# Patient Record
Sex: Female | Born: 1965 | Race: Black or African American | Hispanic: No | Marital: Married | Smoking: Current every day smoker
Health system: Southern US, Community
[De-identification: ages and names within clinical notes are randomized; demographics above are authoritative.]

---

## 2005-05-02 ENCOUNTER — Ambulatory Visit: Payer: Self-pay | Admitting: Family Medicine

## 2005-05-13 ENCOUNTER — Ambulatory Visit: Payer: Self-pay | Admitting: Family Medicine

## 2007-03-16 IMAGING — US US PELV - US TRANSVAGINAL
1 series · 17 of 25 positions shown · non-contrast
Comparison: none

REASON FOR EXAM: Leiomyoma of uterus, eval uterine fibroids
COMMENTS:

[Series 1: us pelv - us transvaginal · 17 of 53 slices shown]
[im 1/53]
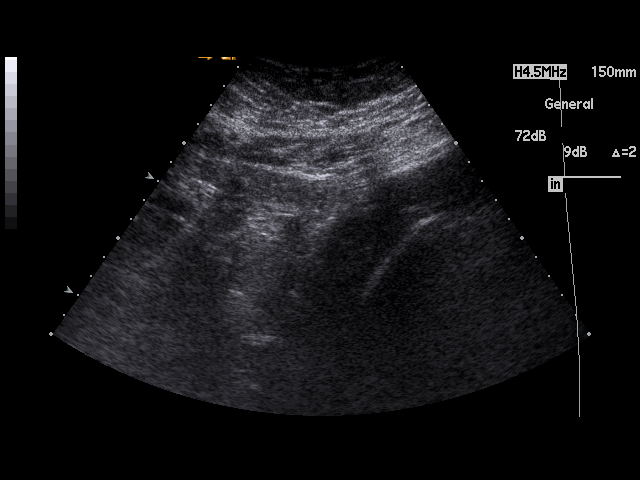
[im 5/53]
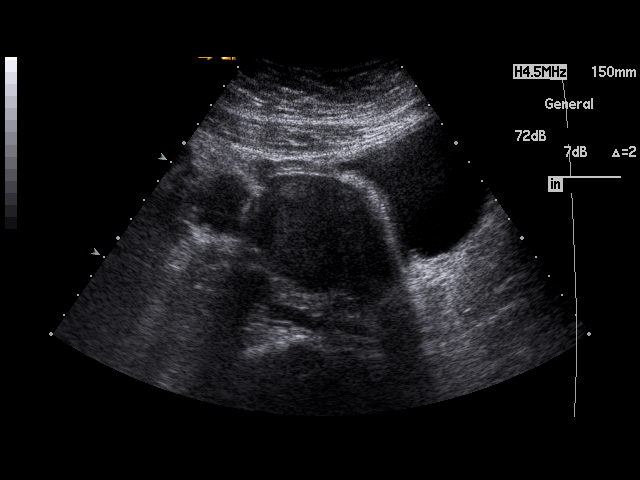
[im 7/53]
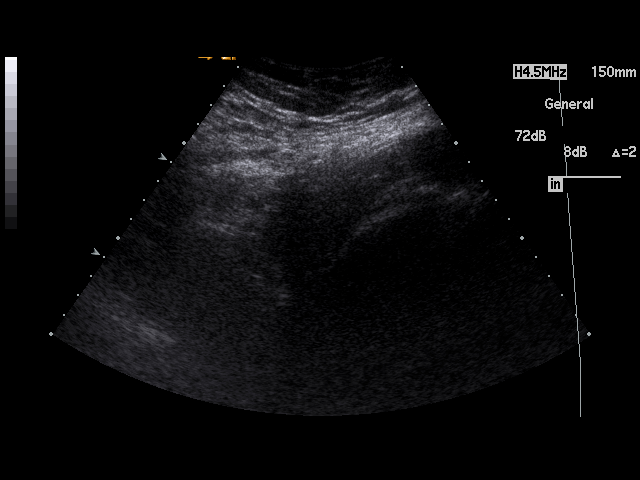
[im 11/53]
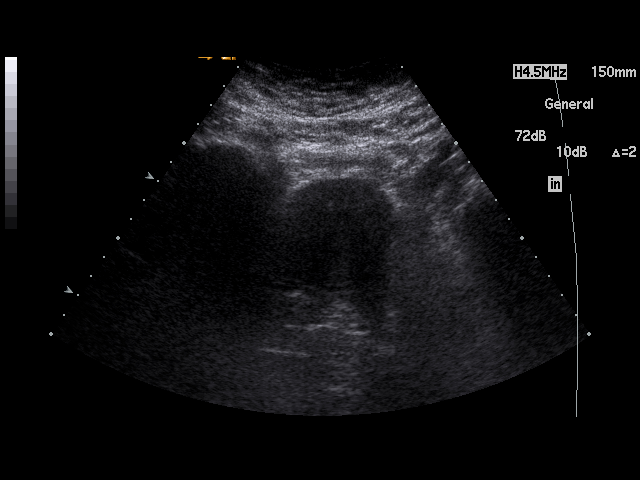
[im 14/53]
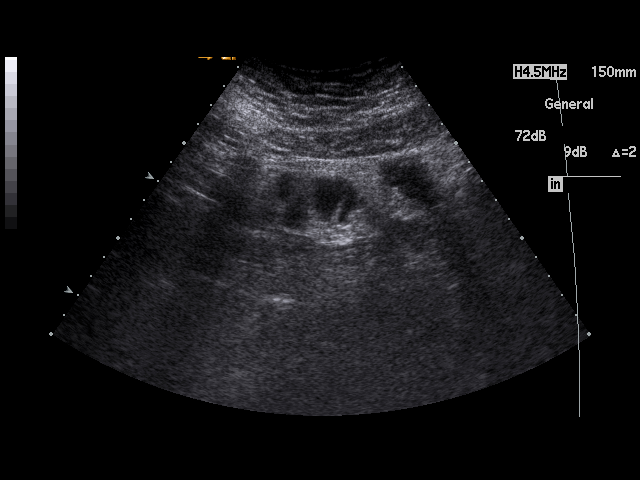
[im 18/53]
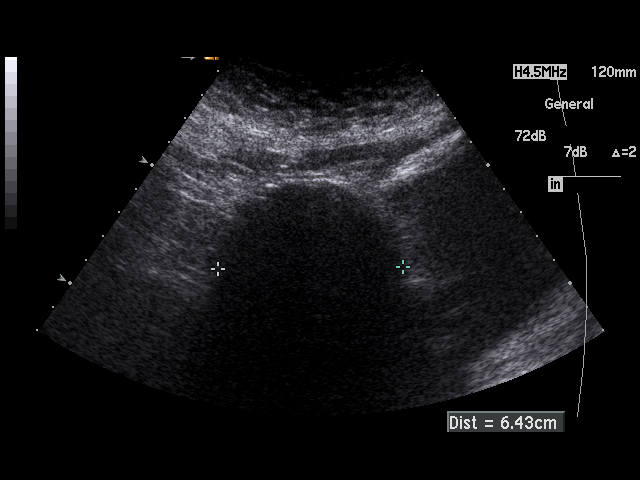
[im 20/53]
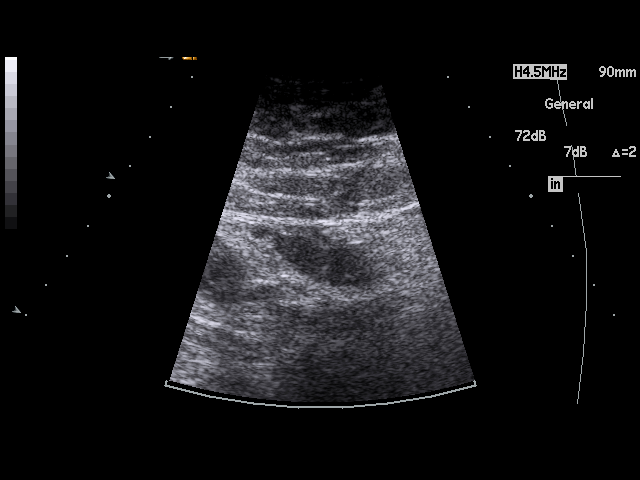
[im 24/53]
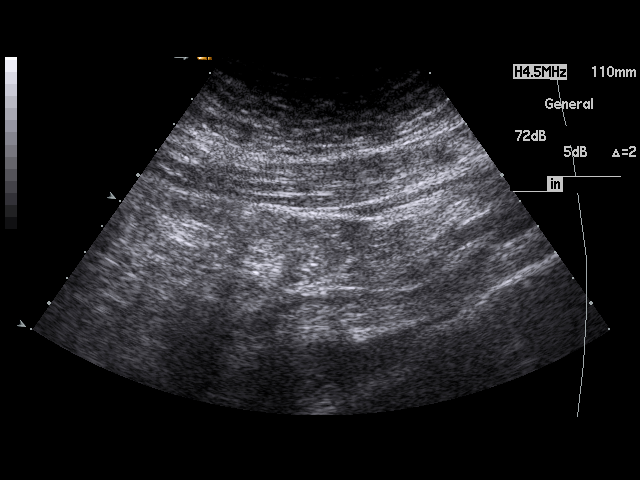
[im 27/53]
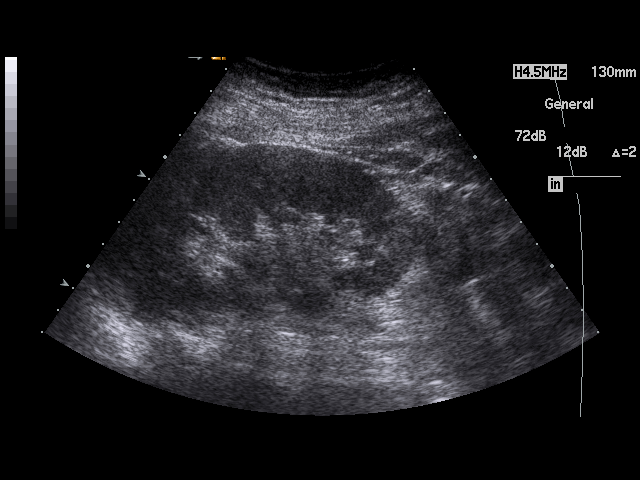
[im 29/53]
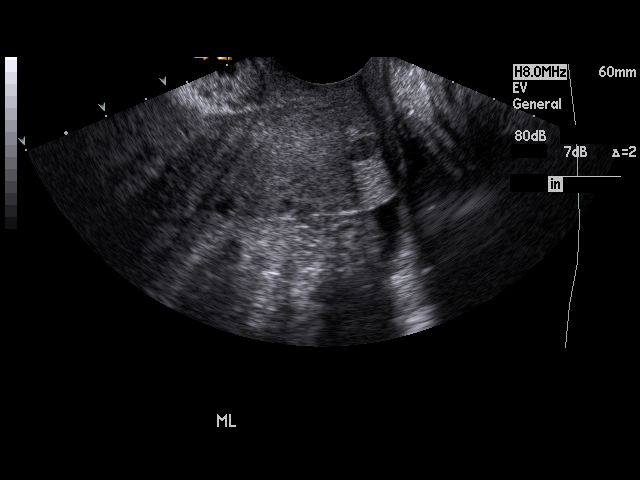
[im 33/53]
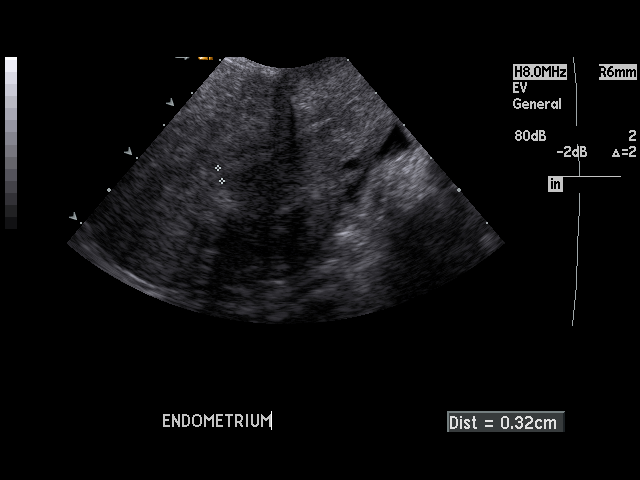
[im 35/53]
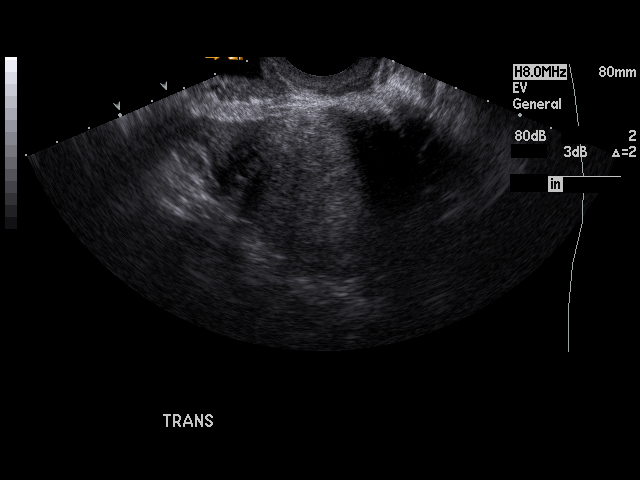
[im 40/53]
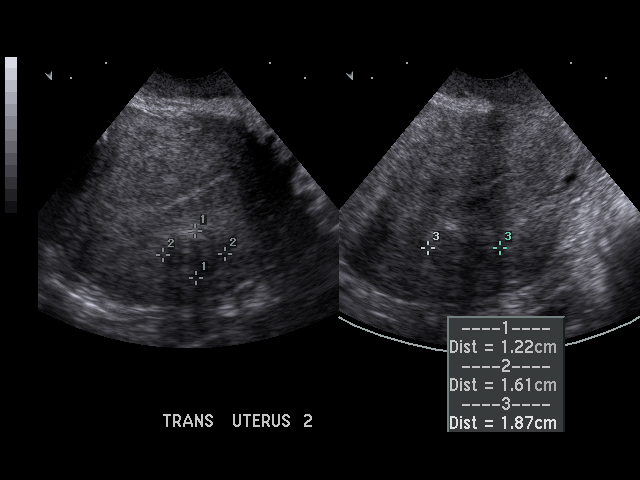
[im 42/53]
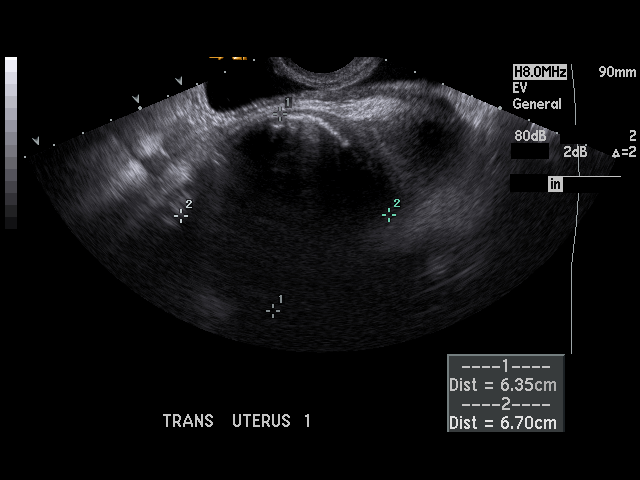
[im 46/53]
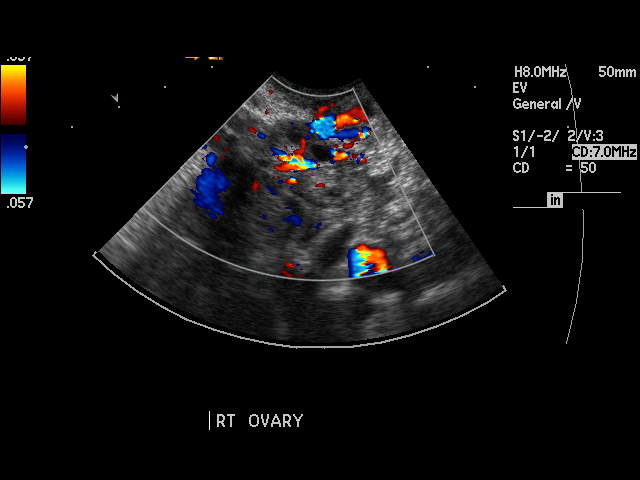
[im 48/53]
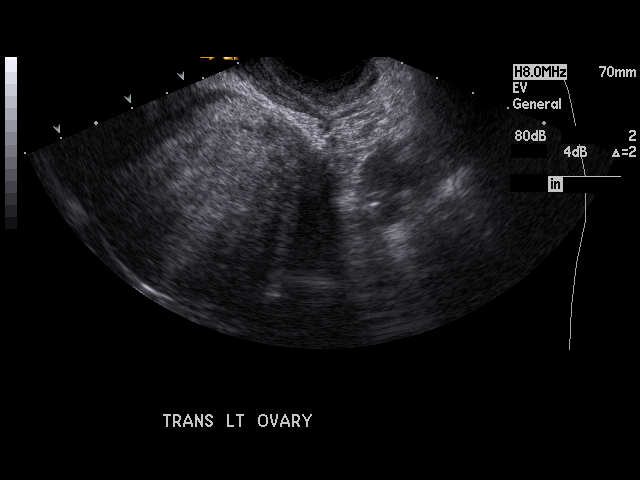
[im 53/53]
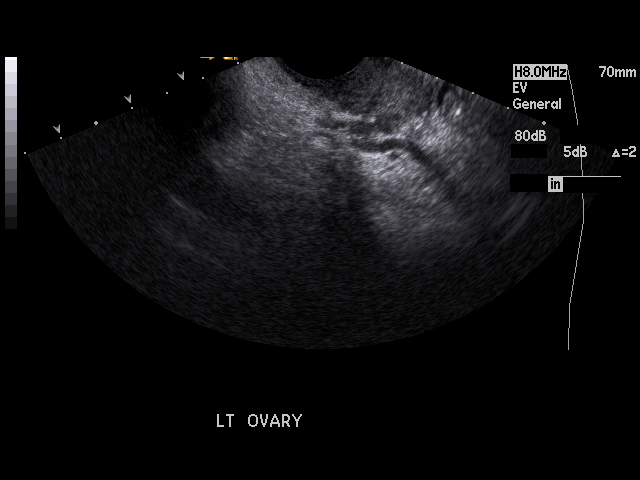

[17 of 25 positions shown; findings below may reference images not displayed]

PROCEDURE:     US  - US PELVIS MASS EXAM  - [DATE] [DATE] [DATE] [DATE]

RESULT:          The uterus measures 9.9 x 5 x 9.9 cm.  A heterogeneous mass
is demonstrated within the intramural portion of the fundus of the uterus
measuring 1.2 x 1.6 x 1.9 cm indicative of a fibroid.  A second large
heterogeneous mass is demonstrated within the fundus of the uterus measuring
6.4 x 7.5 x 5.7 cm, also indicative of a fibroid.  No further uterine masses
are identified.

The LEFT ovary measures 2.5 x 1.8 x 2.9 cm.  The RIGHT ovary measures 1.7 x
1.9 x 2.8 cm.  Each demonstrates peripheral small hypoechoic follicles.  A
trace amount of fluid is demonstrated within the cul-de-sac region.

The RIGHT and LEFT kidneys demonstrate no evidence of hydronephrosis, masses
or calculi.  Note, small cysts are demonstrated within the region of the
cervix indicative of nabothian cysts.
IMPRESSION: 1.     Uterine fibroids within the fundus of the uterus as described above.
2.     No further sonographic abnormalities.

## 2014-04-29 ENCOUNTER — Emergency Department: Payer: Self-pay | Admitting: Emergency Medicine

## 2016-06-04 ENCOUNTER — Encounter: Payer: Self-pay | Admitting: Emergency Medicine

## 2016-06-04 ENCOUNTER — Emergency Department
Admission: EM | Admit: 2016-06-04 | Discharge: 2016-06-04 | Disposition: A | Discharge status: Home / Self Care | Payer: BLUE CROSS/BLUE SHIELD | Attending: Emergency Medicine | Admitting: Emergency Medicine

## 2016-06-04 DIAGNOSIS — Y929 Unspecified place or not applicable: Secondary | ICD-10-CM | POA: Insufficient documentation

## 2016-06-04 DIAGNOSIS — S29012A Strain of muscle and tendon of back wall of thorax, initial encounter: Secondary | ICD-10-CM

## 2016-06-04 DIAGNOSIS — Y999 Unspecified external cause status: Secondary | ICD-10-CM | POA: Insufficient documentation

## 2016-06-04 DIAGNOSIS — S299XXA Unspecified injury of thorax, initial encounter: Secondary | ICD-10-CM | POA: Diagnosis present

## 2016-06-04 DIAGNOSIS — X58XXXA Exposure to other specified factors, initial encounter: Secondary | ICD-10-CM | POA: Insufficient documentation

## 2016-06-04 DIAGNOSIS — F17200 Nicotine dependence, unspecified, uncomplicated: Secondary | ICD-10-CM | POA: Insufficient documentation

## 2016-06-04 DIAGNOSIS — Y939 Activity, unspecified: Secondary | ICD-10-CM | POA: Diagnosis not present

## 2016-06-04 MED ORDER — KETOROLAC TROMETHAMINE 30 MG/ML IJ SOLN
30.0000 mg | Freq: Once | INTRAMUSCULAR | Status: AC
  Administered 2016-06-04: 30 mg via INTRAMUSCULAR
  Filled 2016-06-04: qty 1

## 2016-06-04 MED ORDER — ORPHENADRINE CITRATE 30 MG/ML IJ SOLN
60.0000 mg | Freq: Once | INTRAMUSCULAR | Status: AC
  Administered 2016-06-04: 60 mg via INTRAMUSCULAR
  Filled 2016-06-04: qty 2

## 2016-06-04 MED ORDER — MELOXICAM 15 MG PO TABS: 15.0000 mg | ORAL_TABLET | Freq: Every day | ORAL | 0 refills | Status: AC

## 2016-06-04 MED ORDER — CYCLOBENZAPRINE HCL 10 MG PO TABS: 10.0000 mg | ORAL_TABLET | Freq: Three times a day (TID) | ORAL | 0 refills | Status: AC | PRN

## 2016-06-04 NOTE — ED Notes (Signed)
Pt given drink of water.

## 2016-06-04 NOTE — ED Notes (Signed)
Pt discharged home after verbalizing understanding of discharge instructions; nad noted. 

## 2016-06-04 NOTE — ED Triage Notes (Signed)
Pt has been having mid back pain for 3 days per report. Pt loaded wood yesterday and now back pain is worse. Hurts with any movement. No loss of bowel or bladder.

## 2016-06-04 NOTE — ED Notes (Signed)
Pt presents with back pain x 3-4; loaded wood yesterday and has much more severe pain today. Pt tearful during assessment.

## 2016-06-04 NOTE — ED Provider Notes (Signed)
[facility] Emergency Department Provider Note  ____________________________________________  Time seen: Approximately 5:02 PM  I have reviewed the triage vital signs and the nursing notes.   HISTORY  Chief Complaint Back Pain    HPI Leslie Kelley is a 50 y.o. female who presents emergency department complaining of left-sided back pain. Patient states that she has had some tightness in her mid back for the last 3 days. She states that yesterday she had her sign for loading firewood into a vehicle and the pain has greatly increased since then. Patient reports that it is a spasmodic sensation. She denies any direct trauma to spine. She denies any radicular symptoms in the upper or lower extremities. No bowel or bladder dysfunction, saddle anesthesia, paresthesias. Patient is not taking any medication prior to arrival.   History reviewed. No pertinent past medical history.  There are no active problems to display for this patient.   Past Surgical History:  Procedure Laterality Date  . CESAREAN SECTION      Prior to Admission medications   Medication Sig Start Date End Date Taking? Authorizing Provider  cyclobenzaprine (FLEXERIL) 10 MG tablet Take 1 tablet (10 mg total) by mouth 3 (three) times daily as needed for muscle spasms. 06/04/16   Delorise RoyalsJonathan D Carrington Mullenax, PA-C  meloxicam (MOBIC) 15 MG tablet Take 1 tablet (15 mg total) by mouth daily. 06/04/16   Delorise RoyalsJonathan D Aleenah Homen, PA-C    Allergies Penicillins  History reviewed. No pertinent family history.  Social History Social History  Substance Use Topics  . Smoking status: Current Every Day Smoker  . Smokeless tobacco: Never Used  . Alcohol use No     Review of Systems  Constitutional: No fever/chills Cardiovascular: no chest pain. Respiratory: no cough. No SOB. Genitourinary: Negative for dysuria. No hematuria Musculoskeletal: Positive for left-sided back pain Skin: Negative for rash,  abrasions, lacerations, ecchymosis. Neurological: Negative for headaches, focal weakness or numbness. 10-point ROS otherwise negative.  ____________________________________________   PHYSICAL EXAM:  VITAL SIGNS: ED Triage Vitals  Enc Vitals Group     BP 06/04/16 1633 140/83     Pulse Rate 06/04/16 1633 84     Resp 06/04/16 1633 20     Temp 06/04/16 1633 99.3 F (37.4 C)     Temp Source 06/04/16 1633 Oral     SpO2 06/04/16 1633 100 %     Weight 06/04/16 1630 213 lb (96.6 kg)     Height 06/04/16 1630 5\' 7"  (1.702 m)     Head Circumference --      Peak Flow --      Pain Score 06/04/16 1631 10     Pain Loc --      Pain Edu? --      Excl. in GC? --      Constitutional: Alert and oriented. Well appearing and in no acute distress. Eyes: Conjunctivae are normal. PERRL. EOMI. Head: Atraumatic. Neck: No stridor.  No cervical spine tenderness to palpation.  Cardiovascular: Normal rate, regular rhythm. Normal S1 and S2.  Good peripheral circulation. Respiratory: Normal respiratory effort without tachypnea or retractions. Lungs CTAB. Good air entry to the bases with no decreased or absent breath sounds. Gastrointestinal: Bowel sounds 4 quadrants. Soft and nontender to palpation. No guarding or rigidity. No palpable masses. No distention. No CVA tenderness. Musculoskeletal: Full range of motion to all extremities. No gross deformities appreciated. No visible 40s to spine but inspection. Patient is nontender to palpation over the midline spinal processes. Patient  is also nontender to palpation of the right-sided paraspinal muscle group. Patient has palpable spasms in the left-sided paraspinal muscle group in the T6-L1 region. No tenderness palpation over bilateral sciatic notches. Dorsalis pedis pulse intact bilateral lower extremities. Sensation intact and equal lower extremities. Neurologic:  Normal speech and language. No gross focal neurologic deficits are appreciated.  Skin:  Skin is  warm, dry and intact. No rash noted. Psychiatric: Mood and affect are normal. Speech and behavior are normal. Patient exhibits appropriate insight and judgement.   ____________________________________________   LABS (all labs ordered are listed, but only abnormal results are displayed)  Labs Reviewed - No data to display ____________________________________________  EKG   ____________________________________________  RADIOLOGY   No results found.  ____________________________________________    PROCEDURES  Procedure(s) performed:    Procedures    Medications  ketorolac (TORADOL) 30 MG/ML injection 30 mg (not administered)  orphenadrine (NORFLEX) injection 60 mg (not administered)     ____________________________________________   INITIAL IMPRESSION / ASSESSMENT AND PLAN / ED COURSE  Pertinent labs & imaging results that were available during my care of the patient were reviewed by me and considered in my medical decision making (see chart for details).  Review of the [place] CSRS was performed in accordance of the [facility] prior to dispensing any controlled drugs.  Clinical Course     Patient's diagnosis is consistent with Thoracic paraspinal muscle spasms. The patient presents to the emergency department with a several day history of worsening mid left-sided back pain. Patient had significant lifting yesterday while loading firewood into a truck. Patient has had no direct trauma. No indication at this time for imaging. Patient is given Toradol and muscle relaxer injections to the emergency department.. Patient will be discharged home with prescriptions for anti-inflammatory muscle relaxer. Patient is to follow up with primary care as needed or otherwise directed. Patient is given ED precautions to return to the ED for any worsening or new symptoms.     ____________________________________________  FINAL CLINICAL IMPRESSION(S) / ED DIAGNOSES  Final diagnoses:   Strain of thoracic paraspinal muscles excluding T1 and T2 levels, initial encounter      NEW MEDICATIONS STARTED DURING THIS VISIT:  New Prescriptions   CYCLOBENZAPRINE (FLEXERIL) 10 MG TABLET    Take 1 tablet (10 mg total) by mouth 3 (three) times daily as needed for muscle spasms.   MELOXICAM (MOBIC) 15 MG TABLET    Take 1 tablet (15 mg total) by mouth daily.        This chart was dictated using voice recognition software/Dragon. Despite best efforts to proofread, errors can occur which can change the meaning. Any change was purely unintentional.    Racheal PatchesJonathan D Azhane Eckart, PA-C 06/04/16 1717    Phineas SemenGraydon Goodman, MD 06/04/16 [phone]

## 2021-04-11 ENCOUNTER — Other Ambulatory Visit: Payer: Self-pay | Admitting: Family Medicine

## 2021-04-11 DIAGNOSIS — E049 Nontoxic goiter, unspecified: Secondary | ICD-10-CM

## 2021-04-29 ENCOUNTER — Other Ambulatory Visit: Payer: Self-pay

## 2021-04-29 ENCOUNTER — Ambulatory Visit
Admission: RE | Admit: 2021-04-29 | Discharge: 2021-04-29 | Disposition: A | Discharge status: Home / Self Care | Payer: BC Managed Care – PPO | Source: Ambulatory Visit | Attending: Family Medicine | Admitting: Family Medicine

## 2021-04-29 DIAGNOSIS — E049 Nontoxic goiter, unspecified: Secondary | ICD-10-CM | POA: Insufficient documentation

## 2023-03-13 IMAGING — US US THYROID
1 series · 12 of 25 positions shown · non-contrast
Comparison: None.

CLINICAL DATA: Palpable abnormality. Thyromegaly on physical
examination.

EXAM:
THYROID ULTRASOUND
TECHNIQUE: Ultrasound examination of the thyroid gland and adjacent soft
tissues was performed.

[Series 1: us thyroid · 0.07mm/px · 94 acquisitions, 12 frames shown]
[im 4/94]
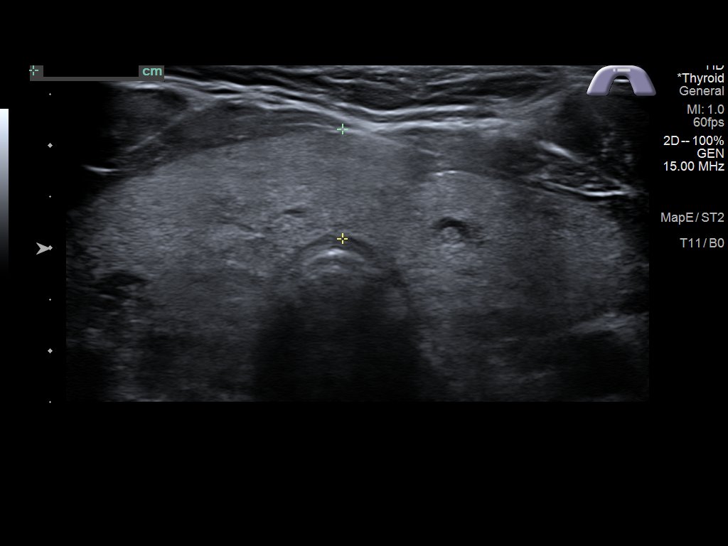
[im 12/94]
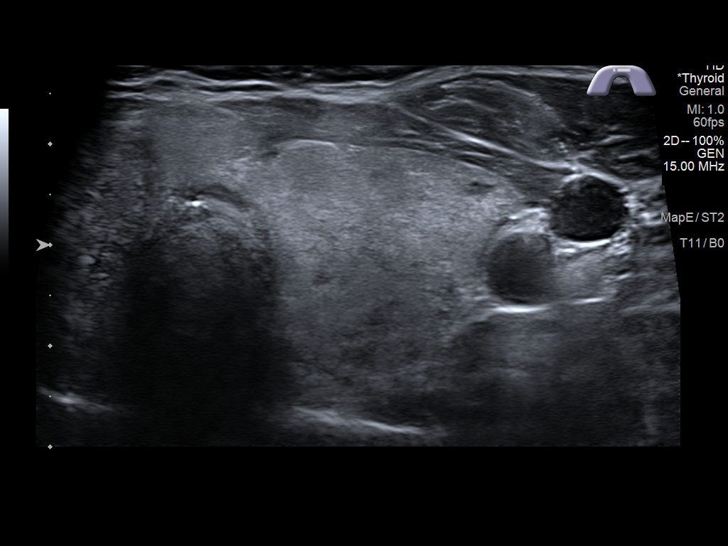
[im 20/94]
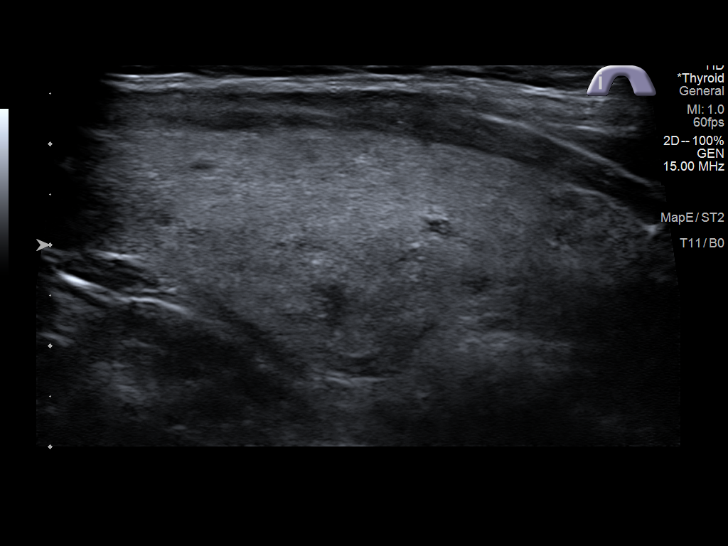
[im 28/94]
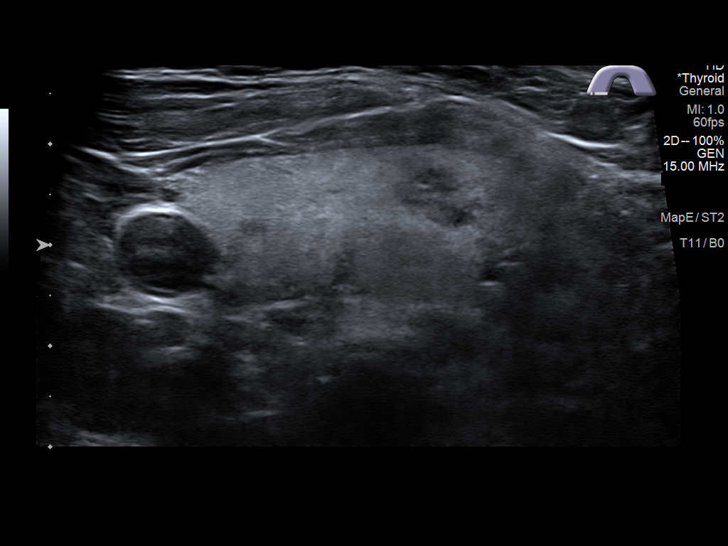
[im 35/94]
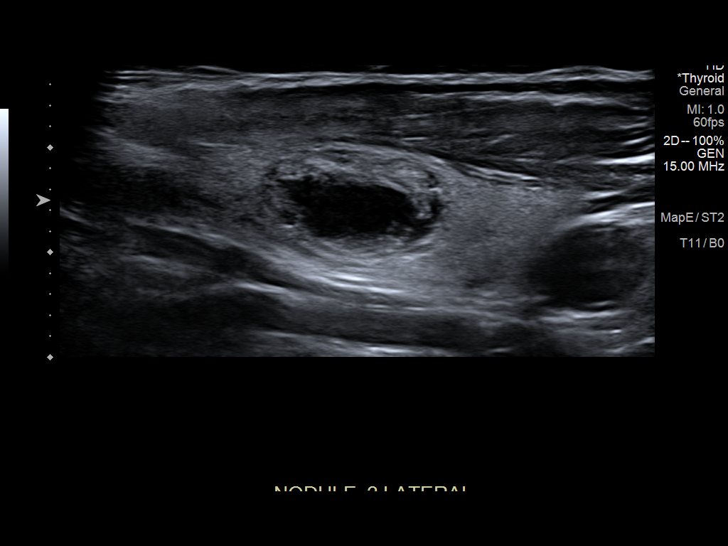
[im 43/94]
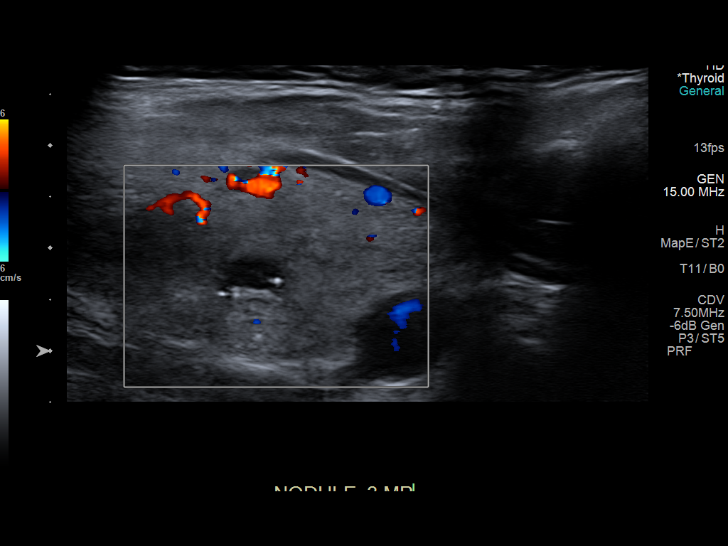
[im 51/94]
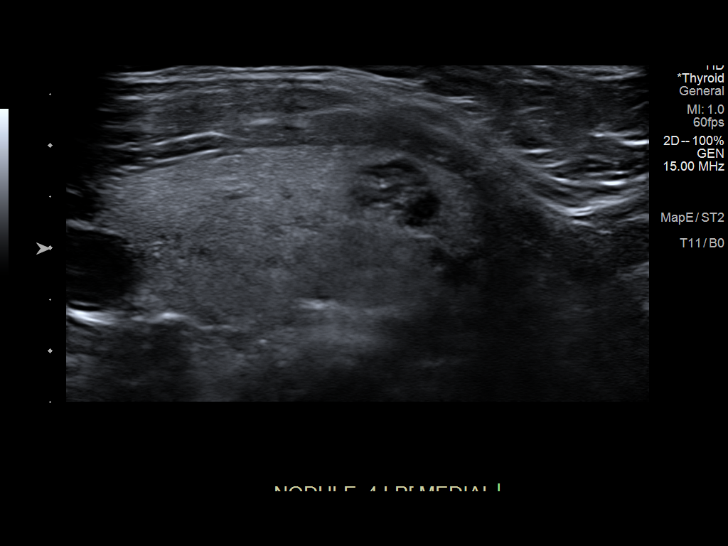
[im 59/94]
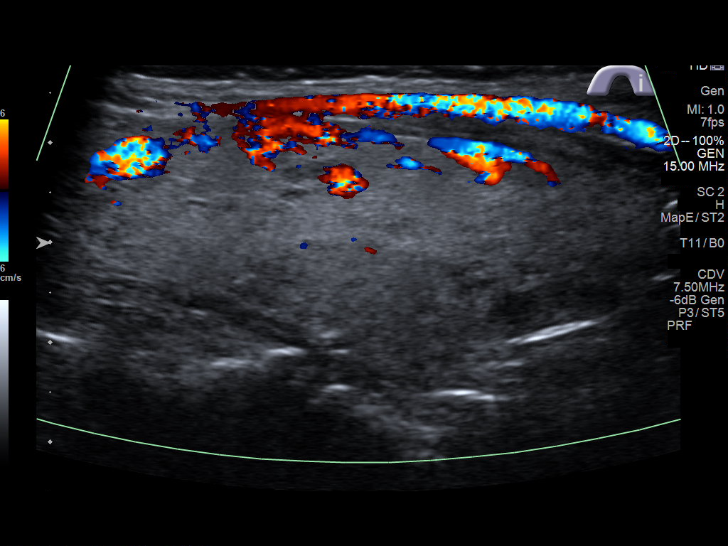
[im 66/94]
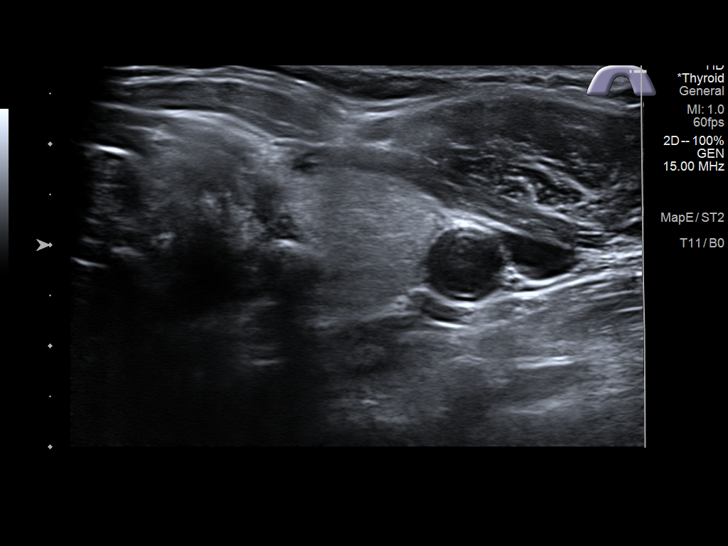
[im 74/94]
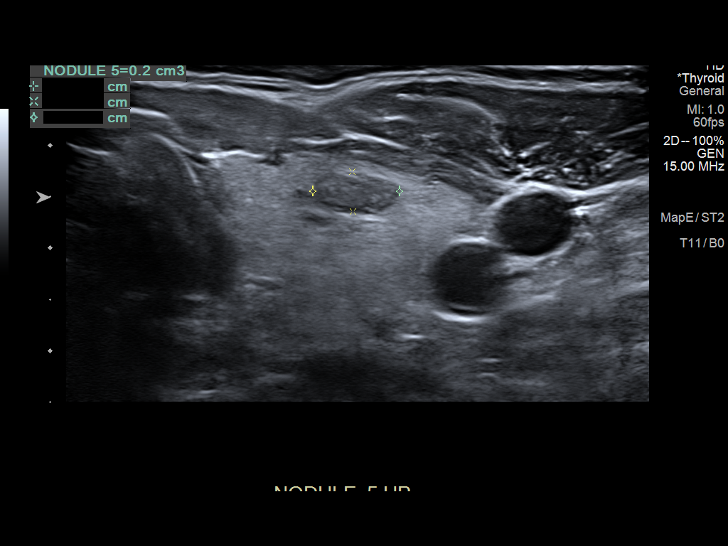
[im 82/94]
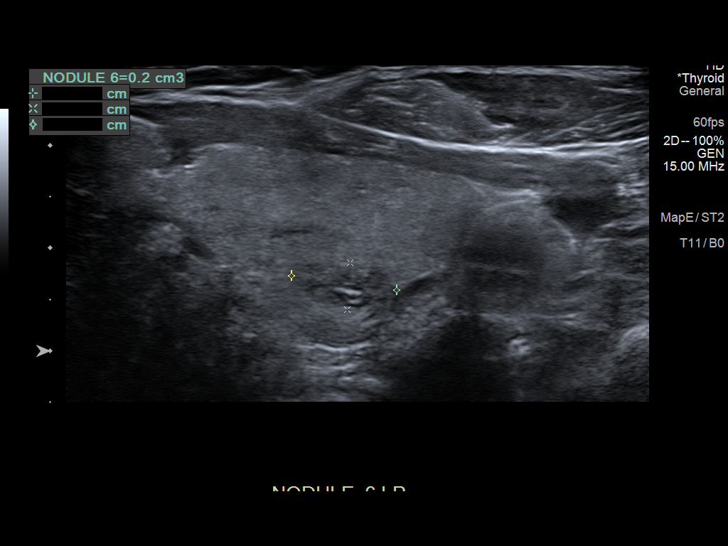
[im 90/94]
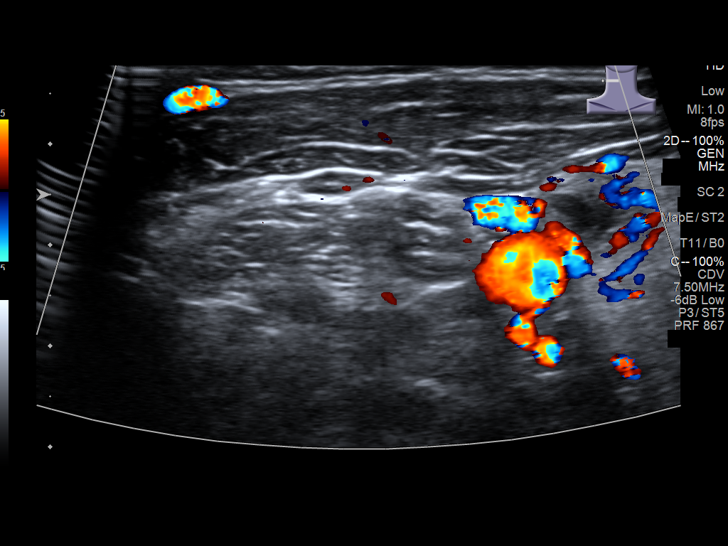

[12 of 25 positions shown; findings below may reference images not displayed]

FINDINGS: Parenchymal Echotexture: Mildly heterogenous

Isthmus: Enlarged measuring 1.1 cm in diameter

Right lobe: Enlarged measuring 5.9 x 2.7 x 2.7 cm

Left lobe: Enlarged measuring 5.9 x 2.4 x 2.5 cm

_________________________________________________________

Estimated total number of nodules >/= 1 cm: 5

Number of spongiform nodules >/=  2 cm not described below (TR1): 0

Number of mixed cystic and solid nodules >/= 1.5 cm not described
below (TR2): 0

_________________________________________________________

There is an approximately 1.3 x 1.2 x 0.6 cm anechoic cyst within
the superior pole the right lobe of the thyroid (labeled 1), which
does not meet criteria to recommend percutaneous sampling or
continued dedicated follow-up.

There is an approximately 1.8 x 1.3 x 1.0 cm partially solid though
predominantly cystic nodule within the superior pole the right lobe
of the thyroid (labeled 2), which does not meet criteria to
recommend percutaneous sampling or continued dedicated follow-up.

There is an approximately 0.8 x 0.7 x 0.6 cm minimally complex cyst
within the mid aspect the right lobe of the thyroid (labeled 3),
which contains an internal echogenic foci ring down artifact
compatible with benign colloid. This benign colloid containing cyst
does not meet criteria to recommend percutaneous sampling or
continued dedicated follow-up

There are 3 adjacent punctate (sub 9 mm) spongiform/benign appearing
cysts within the inferior pole of the right lobe of the thyroid,
none of which meet imaging criteria to recommend percutaneous
sampling or continued dedicated follow-up.

_________________________________________________________

Nodule # 5:

Location: Left; Superior

Maximum size: 1.1 cm; Other 2 dimensions: 0.8 x 0.4 cm

Composition: solid/almost completely solid (2)

Echogenicity: hypoechoic (2)

Shape: not taller-than-wide (0)

Margins: ill-defined (0)

Echogenic foci: none (0)

ACR TI-RADS total points: 4.

ACR TI-RADS risk category: TR4 (4-6 points).

ACR TI-RADS recommendations:

*Given size (>/= 1 - 1.4 cm) and appearance, a follow-up ultrasound
in 1 year should be considered based on TI-RADS criteria.

_________________________________________________________

There is an approximately 0.8 cm isoechoic ill-defined
nodule/pseudonodule in the inferior pole the left lobe of the
thyroid labeled 6), which is not meet criteria to recommend
percutaneous sampling or continued dedicated follow-up.

_________________________________________________________

Nodule # 7:

Location: Left; Inferior

Maximum size: 1.1 cm; Other 2 dimensions: 0.9 x 0.6 cm

Composition: solid/almost completely solid (2)

Echogenicity: hypoechoic (2)

Shape: not taller-than-wide (0)

Margins: ill-defined (0)

Echogenic foci: none (0)

ACR TI-RADS total points: 4.

ACR TI-RADS risk category: TR4 (4-6 points).

ACR TI-RADS recommendations:

*Given size (>/= 1 - 1.4 cm) and appearance, a follow-up ultrasound
in 1 year should be considered based on TI-RADS criteria.

_________________________________________________________
IMPRESSION: 1. Thyromegaly with findings suggestive of multinodular goiter.
2. Nodules #5 and #7 both meet imaging criteria to recommend a 1
year follow-up.
3. None of the additional remaining thyroid nodules/cysts meet
imaging criteria to recommend percutaneous sampling or continued
dedicated follow-up.

The above is in keeping with the ACR TI-RADS recommendations - [HOSPITAL] 9653;[DATE].

## 2023-06-18 ENCOUNTER — Other Ambulatory Visit: Payer: Self-pay | Admitting: Family Medicine

## 2023-06-18 DIAGNOSIS — E041 Nontoxic single thyroid nodule: Secondary | ICD-10-CM

## 2023-06-25 ENCOUNTER — Ambulatory Visit: Payer: BC Managed Care – PPO
